# Patient Record
Sex: Female | Born: 1975 | Race: Black or African American | Hispanic: No | Smoking: Never smoker
Health system: Southern US, Community
[De-identification: ages and names within clinical notes are randomized; demographics above are authoritative.]

## PROBLEM LIST (undated history)

## (undated) DIAGNOSIS — E669 Obesity, unspecified: Secondary | ICD-10-CM

## (undated) DIAGNOSIS — E282 Polycystic ovarian syndrome: Secondary | ICD-10-CM

## (undated) DIAGNOSIS — I10 Essential (primary) hypertension: Secondary | ICD-10-CM

## (undated) HISTORY — PX: TUBAL LIGATION: SHX77

## (undated) HISTORY — PX: HERNIA REPAIR: SHX51

## (undated) HISTORY — PX: CHOLECYSTECTOMY: SHX55

## (undated) HISTORY — PX: STOMACH SURGERY: SHX791

## (undated) HISTORY — PX: LAPAROSCOPIC GASTRIC SLEEVE RESECTION: SHX5895

---

## 2017-02-10 ENCOUNTER — Encounter (HOSPITAL_BASED_OUTPATIENT_CLINIC_OR_DEPARTMENT_OTHER): Payer: Self-pay | Admitting: Emergency Medicine

## 2017-02-10 ENCOUNTER — Emergency Department (HOSPITAL_BASED_OUTPATIENT_CLINIC_OR_DEPARTMENT_OTHER)
Admission: EM | Admit: 2017-02-10 | Discharge: 2017-02-10 | Disposition: A | Discharge status: Home / Self Care | Payer: 59 | Attending: Emergency Medicine | Admitting: Emergency Medicine

## 2017-02-10 ENCOUNTER — Other Ambulatory Visit: Payer: Self-pay

## 2017-02-10 ENCOUNTER — Emergency Department (HOSPITAL_BASED_OUTPATIENT_CLINIC_OR_DEPARTMENT_OTHER): Payer: 59

## 2017-02-10 DIAGNOSIS — Y999 Unspecified external cause status: Secondary | ICD-10-CM | POA: Diagnosis not present

## 2017-02-10 DIAGNOSIS — S8002XA Contusion of left knee, initial encounter: Secondary | ICD-10-CM | POA: Diagnosis not present

## 2017-02-10 DIAGNOSIS — Y9389 Activity, other specified: Secondary | ICD-10-CM | POA: Insufficient documentation

## 2017-02-10 DIAGNOSIS — Z79899 Other long term (current) drug therapy: Secondary | ICD-10-CM | POA: Diagnosis not present

## 2017-02-10 DIAGNOSIS — S60222A Contusion of left hand, initial encounter: Secondary | ICD-10-CM

## 2017-02-10 DIAGNOSIS — Y92410 Unspecified street and highway as the place of occurrence of the external cause: Secondary | ICD-10-CM | POA: Insufficient documentation

## 2017-02-10 DIAGNOSIS — I10 Essential (primary) hypertension: Secondary | ICD-10-CM | POA: Insufficient documentation

## 2017-02-10 DIAGNOSIS — S8001XA Contusion of right knee, initial encounter: Secondary | ICD-10-CM

## 2017-02-10 DIAGNOSIS — S6992XA Unspecified injury of left wrist, hand and finger(s), initial encounter: Secondary | ICD-10-CM | POA: Diagnosis present

## 2017-02-10 HISTORY — DX: Essential (primary) hypertension: I10

## 2017-02-10 MED ORDER — METHOCARBAMOL 500 MG PO TABS: 500.0000 mg | ORAL_TABLET | Freq: Three times a day (TID) | ORAL | 0 refills | Status: DC | PRN

## 2017-02-10 MED ORDER — IBUPROFEN 600 MG PO TABS: 600.0000 mg | ORAL_TABLET | Freq: Three times a day (TID) | ORAL | 0 refills | Status: AC | PRN

## 2017-02-10 MED ORDER — IBUPROFEN 400 MG PO TABS
600.0000 mg | ORAL_TABLET | Freq: Once | ORAL | Status: AC
  Administered 2017-02-10: 600 mg via ORAL
  Filled 2017-02-10: qty 1

## 2017-02-10 MED ORDER — HYDROCODONE-ACETAMINOPHEN 5-325 MG PO TABS
1.0000 | ORAL_TABLET | Freq: Once | ORAL | Status: AC
  Administered 2017-02-10: 1 via ORAL
  Filled 2017-02-10: qty 1

## 2017-02-10 MED ORDER — CYCLOBENZAPRINE HCL 5 MG PO TABS
5.0000 mg | ORAL_TABLET | Freq: Once | ORAL | Status: AC
  Administered 2017-02-10: 5 mg via ORAL
  Filled 2017-02-10: qty 1

## 2017-02-10 MED FILL — METHOCARBAMOL 500 MG TABLET: 500 | 4 days supply | Qty: 12 | Fill #0

## 2017-02-10 MED FILL — IBUPROFEN 600 MG TABLET: 600 | 5 days supply | Qty: 15 | Fill #0

## 2017-02-10 NOTE — ED Notes (Signed)
ED Provider at bedside. 
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
## 2017-02-10 NOTE — ED Notes (Signed)
Patient transported to X-ray 
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
## 2022-11-14 ENCOUNTER — Ambulatory Visit
Admission: EM | Admit: 2022-11-14 | Discharge: 2022-11-14 | Disposition: A | Discharge status: Home / Self Care | Payer: BC Managed Care – PPO

## 2022-11-14 DIAGNOSIS — H1031 Unspecified acute conjunctivitis, right eye: Secondary | ICD-10-CM | POA: Diagnosis not present

## 2022-11-14 HISTORY — DX: Polycystic ovarian syndrome: E28.2

## 2022-11-14 MED ORDER — CIPROFLOXACIN HCL 0.3 % OP SOLN: OPHTHALMIC | 0 refills | Status: DC

## 2022-11-14 NOTE — ED Triage Notes (Signed)
Pt c/o swelling, redness, and drainage to R eye that began today. Eye was somewhat irritated last night. Pt wears contacts. No change in vision.

## 2022-11-14 NOTE — ED Provider Notes (Signed)
[facility]    CSN: [id number] Arrival date & time: 11/14/22  1906    HISTORY   Chief Complaint  Patient presents with   Eye Problem   HPI Danielle Koch is a pleasant, 47 y.o. female who presents to urgent care today. Patient states she wears contacts that she changes daily.  States that she noticed that her right eye was irritated last night.  States that this morning, she noticed that her right eye was swollen, red and draining.  Patient denies vision changes, frank eye pain, trauma to her right eye.  The history is provided by the patient.   Past Medical History:  Diagnosis Date   Hypertension    PCOS (polycystic ovarian syndrome)    There are no problems to display for this patient.  Past Surgical History:  Procedure Laterality Date   CHOLECYSTECTOMY     HERNIA REPAIR     STOMACH SURGERY     TUBAL LIGATION     OB History   No obstetric history on file.    Home Medications    Prior to Admission medications   Medication Sig Start Date End Date Taking? Authorizing Provider  amLODipine (NORVASC) 10 MG tablet Take 10 mg by mouth daily.   Yes [provider]  carvedilol (COREG) 12.5 MG tablet Take by mouth. 06/28/21 01/11/23 Yes [provider]  spironolactone (ALDACTONE) 25 MG tablet Take 12.5 mg by mouth. 10/14/22 10/14/23 Yes [provider]  hydrochlorothiazide (HYDRODIURIL) 25 MG tablet Take 25 mg by mouth daily. Patient not taking: Reported on 11/14/2022    [provider]  ibuprofen (ADVIL,MOTRIN) 600 MG tablet Take 1 tablet (600 mg total) by mouth every 8 (eight) hours as needed. 02/10/17   Azalia Bilis, MD  methocarbamol (ROBAXIN) 500 MG tablet Take 1 tablet (500 mg total) by mouth every 8 (eight) hours as needed for muscle spasms. 02/10/17   Azalia Bilis, MD    Family History No family history on file. Social History Social History   Tobacco Use   Smoking status: Never   Smokeless tobacco: Never  Substance Use  Topics   Alcohol use: Yes   Drug use: No   Allergies   Sumatriptan and Demerol [meperidine]  Review of Systems Review of Systems Pertinent findings revealed after performing a 14 point review of systems has been noted in the history of present illness.  Physical Exam Vital Signs BP (!) 163/107 (BP Location: Right Arm)   Pulse 66   Temp 98.1 F (36.7 C) (Oral)   Resp 18   SpO2 97%   No data found.  Physical Exam Vitals and nursing note reviewed.  Constitutional:      General: She is not in acute distress.    Appearance: Normal appearance.  HENT:     Head: Normocephalic and atraumatic.  Eyes:     General: Vision grossly intact.        Right eye: Discharge present. No foreign body or hordeolum.        Left eye: No foreign body, discharge or hordeolum.     Extraocular Movements: Extraocular movements intact.     Conjunctiva/sclera:     Right eye: Right conjunctiva is injected. Exudate present. No chemosis or hemorrhage.    Left eye: Left conjunctiva is not injected. No chemosis, exudate or hemorrhage.    Pupils: Pupils are equal, round, and reactive to light.     Comments: Right upper and lower eyelid with mild erythema and swelling  Cardiovascular:  Rate and Rhythm: Normal rate and regular rhythm.  Pulmonary:     Effort: Pulmonary effort is normal.     Breath sounds: Normal breath sounds.  Musculoskeletal:        General: Normal range of motion.     Cervical back: Normal range of motion and neck supple.  Skin:    General: Skin is warm and dry.  Neurological:     General: No focal deficit present.     Mental Status: She is alert and oriented to person, place, and time. Mental status is at baseline.  Psychiatric:        Mood and Affect: Mood normal.        Behavior: Behavior normal.        Thought Content: Thought content normal.        Judgment: Judgment normal.     Visual Acuity Right Eye Distance:   Left Eye Distance:   Bilateral Distance:    Right  Eye Near:   Left Eye Near:    Bilateral Near:     UC Couse / Diagnostics / Procedures:     Radiology No results found.  Procedures Procedures (including critical care time) EKG  Pending results:  Labs Reviewed - No data to display  Medications Ordered in UC: Medications - No data to display  UC Diagnoses / Final Clinical Impressions(s)   I have reviewed the triage vital signs and the nursing notes.  Pertinent labs & imaging results that were available during my care of the patient were reviewed by me and considered in my medical decision making (see chart for details).    Final diagnoses:  Acute bacterial conjunctivitis of right eye   Patient provided with a 7-day course of ciprofloxacin eyedrops for treatment of acute bacterial conjunctivitis in her right eye.  Patient advised not to wear her contacts until completing treatment.  Patient advised to see her eye doctor or go to the emergency room if symptoms worsen despite treatment.  Please see discharge instructions below for details of plan of care as provided to patient. ED Prescriptions     Medication Sig Dispense Auth. Provider   ciprofloxacin (CILOXAN) 0.3 % ophthalmic solution Administer 1 drop, every 2 hours, while awake, for 2 days. Then 1 drop, every 4 hours, while awake, for the next 5 days. 5 mL Theadora Rama Scales, PA-C      PDMP not reviewed this encounter.  Pending results:  Labs Reviewed - No data to display  Discharge Instructions:   Discharge Instructions      Please read below to learn more about the medications, dosages and frequencies that I recommend to help alleviate your symptoms and to get you feeling better soon:   Ciloxan (ciprofloxacin): Please instill 1 drop into the affected eye(s) every 2 hours while awake for days 1 and 2 then instill 1 drop into the affected eye(s) every 4 hours while awake for days 3 through 7.  Please be sure that you finish the full 7-day treatment.   If  symptoms have not meaningfully improved in the next 5 to 7 days, please return for repeat evaluation or follow-up with your regular provider.  If symptoms have worsened in the next 3 to 5 days, please follow-up with your eye doctor or go to the emergency room.  Thank you for visiting urgent care today.  We appreciate the opportunity to participate in your care.       Disposition Upon Discharge:  Condition: stable for discharge home  Patient presented with an acute illness with associated systemic symptoms and significant discomfort requiring urgent management. In my opinion, this is a condition that a prudent lay person (someone who possesses an average knowledge of health and medicine) may potentially expect to result in complications if not addressed urgently such as respiratory distress, impairment of bodily function or dysfunction of bodily organs.   Routine symptom specific, illness specific and/or disease specific instructions were discussed with the patient and/or caregiver at length.   As such, the patient has been evaluated and assessed, work-up was performed and treatment was provided in alignment with urgent care protocols and evidence based medicine.  Patient/parent/caregiver has been advised that the patient may require follow up for further testing and treatment if the symptoms continue in spite of treatment, as clinically indicated and appropriate.  Patient/parent/caregiver has been advised to return to the [facility] or PCP if no better; to PCP or the Emergency Department if new signs and symptoms develop, or if the current signs or symptoms continue to change or worsen for further workup, evaluation and treatment as clinically indicated and appropriate  The patient will follow up with their current PCP if and as advised. If the patient does not currently have a PCP we will assist them in obtaining one.   The patient may need specialty follow up if the symptoms continue, in spite of  conservative treatment and management, for further workup, evaluation, consultation and treatment as clinically indicated and appropriate.  Patient/parent/caregiver verbalized understanding and agreement of plan as discussed.  All questions were addressed during visit.  Please see discharge instructions below for further details of plan.  This office note has been dictated using Teaching laboratory technician.  Unfortunately, this method of dictation can sometimes lead to typographical or grammatical errors.  I apologize for your inconvenience in advance if this occurs.  Please do not hesitate to reach out to me if clarification is needed.      Theadora Rama Scales, [place] 11/14/22 1931

## 2022-11-14 NOTE — Discharge Instructions (Signed)
Please read below to learn more about the medications, dosages and frequencies that I recommend to help alleviate your symptoms and to get you feeling better soon:   Ciloxan (ciprofloxacin): Please instill 1 drop into the affected eye(s) every 2 hours while awake for days 1 and 2 then instill 1 drop into the affected eye(s) every 4 hours while awake for days 3 through 7.  Please be sure that you finish the full 7-day treatment.   If symptoms have not meaningfully improved in the next 5 to 7 days, please return for repeat evaluation or follow-up with your regular provider.  If symptoms have worsened in the next 3 to 5 days, please follow-up with your eye doctor or go to the emergency room.  Thank you for visiting urgent care today.  We appreciate the opportunity to participate in your care.

## 2022-12-19 ENCOUNTER — Ambulatory Visit
Admission: EM | Admit: 2022-12-19 | Discharge: 2022-12-19 | Disposition: A | Discharge status: Home / Self Care | Payer: BC Managed Care – PPO

## 2022-12-19 ENCOUNTER — Ambulatory Visit: Payer: BC Managed Care – PPO

## 2022-12-19 DIAGNOSIS — M79644 Pain in right finger(s): Secondary | ICD-10-CM

## 2022-12-19 DIAGNOSIS — M79641 Pain in right hand: Secondary | ICD-10-CM | POA: Diagnosis not present

## 2022-12-19 DIAGNOSIS — M25522 Pain in left elbow: Secondary | ICD-10-CM | POA: Diagnosis not present

## 2022-12-19 DIAGNOSIS — M79632 Pain in left forearm: Secondary | ICD-10-CM | POA: Diagnosis not present

## 2022-12-19 NOTE — ED Provider Notes (Signed)
[facility]  Note:  This document was prepared using Dragon voice recognition software and may include unintentional dictation errors.  MRN: [id number] DOB: July 08, 1975 DATE: 12/19/22   Subjective:  Chief Complaint:  Chief Complaint  Patient presents with   Finger Injury    HPI: Danielle Koch is a 47 y.o. female presenting for right middle finger pain and left elbow pain. Patient states she caught her right middle finger in the car door handle on Saturday.  Reports pain with flexion.  Patient is right-hand dominant.  No prior injury or fracture.  Patient also presents for left elbow pain for 2 days.  Patient states she ran into the brick wall at her house and has been having ongoing elbow pain since.  Reports pain with flexion.  No prior injury or fracture.  Patient has taken ibuprofen with no relief. Denies fever, nausea/vomiting, abdominal pain, numbness/tingling. Endorses right middle finger pain, left elbow pain. Presents NAD.  Prior to Admission medications   Medication Sig Start Date End Date Taking? Authorizing Provider  nystatin (MYCOSTATIN/NYSTOP) powder Apply topically. 12/16/22 06/14/23 Yes [provider]  omeprazole (PRILOSEC) 20 MG capsule Take 1 tablet by mouth daily. 03/23/22  Yes [provider]  OZEMPIC, 1 MG/DOSE, 4 MG/3ML SOPN Inject into the skin. 03/23/22  Yes [provider]  amLODipine (NORVASC) 10 MG tablet Take 10 mg by mouth daily.    [provider]  carvedilol (COREG) 12.5 MG tablet Take by mouth. 06/28/21 01/11/23  [provider]  ciprofloxacin (CILOXAN) 0.3 % ophthalmic solution Administer 1 drop, every 2 hours, while awake, for 2 days. Then 1 drop, every 4 hours, while awake, for the next 5 days. 11/14/22   Theadora Rama Scales, PA-C  ibuprofen (ADVIL,MOTRIN) 600 MG tablet Take 1 tablet (600 mg total) by mouth every 8 (eight) hours as needed. 02/10/17   Azalia Bilis, MD  methocarbamol (ROBAXIN) 500 MG tablet Take  1 tablet (500 mg total) by mouth every 8 (eight) hours as needed for muscle spasms. 02/10/17   Azalia Bilis, MD  spironolactone (ALDACTONE) 25 MG tablet Take 12.5 mg by mouth. 10/14/22 10/14/23  [provider]     Allergies  Allergen Reactions   Sumatriptan Other (See Comments)    Also reports she developed a knot under her chin and jaw tightness  Also reports she developed a knot under her chin and jaw tightness, , And Hives  Also reports she developed a knot under her chin and jaw tightness    And Hives  Other reaction(s): Dizziness (intolerance), Other  Also reports she developed a knot under her chin and jaw tightness  Also reports she developed a knot under her chin and jaw tightness    And Hives   Demerol [Meperidine]     History:   Past Medical History:  Diagnosis Date   Hypertension    PCOS (polycystic ovarian syndrome)      Past Surgical History:  Procedure Laterality Date   CHOLECYSTECTOMY     HERNIA REPAIR     STOMACH SURGERY     TUBAL LIGATION      History reviewed. No pertinent family history.  Social History   Tobacco Use   Smoking status: Never   Smokeless tobacco: Never  Substance Use Topics   Alcohol use: Yes   Drug use: No    Review of Systems  Constitutional:  Negative for fever.  Gastrointestinal:  Negative for abdominal pain, nausea and vomiting.  Musculoskeletal:  Positive for arthralgias.  Objective:   Vitals: BP (!) 165/108 (BP Location: Right Arm)   Pulse 73   Temp 98.5 F (36.9 C) (Oral)   Resp 16   SpO2 98%   Physical Exam Constitutional:      General: She is not in acute distress.    Appearance: Normal appearance. She is well-developed. She is morbidly obese. She is not ill-appearing or toxic-appearing.  HENT:     Head: Normocephalic and atraumatic.  Cardiovascular:     Rate and Rhythm: Normal rate and regular rhythm.     Pulses:          Radial pulses are 2+ on the right side and 2+ on the left side.      Heart sounds: Normal heart sounds.  Pulmonary:     Effort: Pulmonary effort is normal.     Breath sounds: Normal breath sounds.     Comments: Clear to auscultation bilaterally  Abdominal:     General: Bowel sounds are normal.     Palpations: Abdomen is soft.     Tenderness: There is no abdominal tenderness.  Musculoskeletal:     Right elbow: Normal.     Left elbow: Decreased range of motion. Tenderness present in lateral epicondyle.     Right hand: Tenderness present. Decreased range of motion. Normal pulse.     Left hand: Normal.     Comments: Tenderness to palpation of right third digit at PIP.  Decreased range of motion due to pain with flexion.  Neurovascular intact.  No warmth, erythema, discharge.  Tenderness to palpation of lateral epicondyle.  Decreased range of motion due to pain with flexion and extension.  Neurovascular intact.  No warmth, erythema, discharge.  Skin:    General: Skin is warm and dry.  Neurological:     General: No focal deficit present.     Mental Status: She is alert.  Psychiatric:        Mood and Affect: Mood and affect normal.     Results:  Labs: No results found for this or any previous visit (from the past 24 hour(s)).  Radiology: DG Forearm Left  Result Date: 12/19/2022 CLINICAL DATA:  Left forearm injury with pain EXAM: LEFT FOREARM - 2 VIEW COMPARISON:  None Available. FINDINGS: No fracture. No evidence of malalignment at the left wrist or left elbow. No focal osseous lesions. Small posterior left olecranon enthesophyte. No radiopaque foreign bodies. IMPRESSION: No fracture or malalignment. Small posterior left olecranon enthesophyte. Electronically Signed   By: Delbert Phenix M.D.   On: 12/19/2022 19:43   DG Hand Complete Right  Result Date: 12/19/2022 CLINICAL DATA:  Third digit pain after injury. EXAM: RIGHT HAND - COMPLETE 3+ VIEW COMPARISON:  None Available. FINDINGS: There is a well corticated density along the dorsal aspect of the  third distal interphalangeal joint which is favored as chronic. No acute fracture or dislocation identified. Joint spaces are maintained. There is a small exostosis from the lateral aspect of the second metacarpal head. There is no evidence of arthropathy or other focal bone abnormality. Soft tissues are unremarkable. IMPRESSION: Well corticated density along the dorsal aspect of the third distal interphalangeal joint is favored as chronic. No acute fracture or dislocation identified. Electronically Signed   By: Darliss Cheney M.D.   On: 12/19/2022 19:42     UC Course/Treatments:  Procedures: Splint Application  Date/Time: 12/19/2022 8:01 PM  Performed by: Cynda Acres, PA-C Authorized by: Cynda Acres, PA-C   Consent:  Consent obtained:  Verbal   Consent given by:  Patient   Risks, benefits, and alternatives were discussed: yes     Risks discussed:  Pain   Alternatives discussed:  No treatment Pre-procedure details:    Distal neurologic exam:  Normal   Distal perfusion: distal pulses strong and brisk capillary refill   Procedure details:    Location:  Finger   Finger location:  R long finger   Splint type:  Finger   Supplies:  Aluminum splint Post-procedure details:    Distal neurologic exam:  Normal   Distal perfusion: distal pulses strong and brisk capillary refill     Procedure completion:  Tolerated well, no immediate complications    Medications Ordered in UC: Medications - No data to display   Assessment and Plan :     ICD-10-CM   1. Pain of right middle finger  M79.644     2. Left elbow pain  M25.522      Pain of right middle finger Afebrile, nontoxic-appearing, NAD. VSS. DDX includes but not limited to: Fracture, dislocation, contusion Imaging was negative for fracture or dislocation.  Splint was placed.  Recommend RICE regimen and over-the-counter analgesia as needed for pain.  If no improvement, recommend she follow-up with orthopedics. Strict ED  precautions were given and patient verbalized understanding.  Left elbow pain Afebrile, nontoxic-appearing, NAD. VSS. DDX includes but not limited to: Fracture, dislocation, contusion Imaging was negative for fracture or dislocation. Recommend RICE regimen and over-the-counter analgesia as needed for pain.  If no improvement, recommend she follow-up with orthopedics. Strict ED precautions were given and patient verbalized understanding.    ED Discharge Orders     None        I have reviewed the PDMP during this encounter.     Cynda Acres, PA-C 12/19/22 2002

## 2022-12-19 NOTE — ED Triage Notes (Signed)
Pt c/o right middle finger pain and swelling states she jammed finger on Saturday. Pt c/o L shoulder and left elbow pain x 2 days upon waking. Pt states she did bump her arm on door jam while walking through door the day before.

## 2022-12-19 NOTE — Discharge Instructions (Addendum)
Your x-rays showed no broken bones or dislocations. They were sent to a radiologist for further evaluation and we will call you if the radiologist sees anything different.   Recommend rest, ice, compression, and elevation. You can alternate Tylenol and NSAIDs (Ibuprofen, Alleve) as needed for pain.   You were provided with a brace/splint today in office. I recommend you wear it for the next week. If no improvement within that time, you should follow up with orthopedics.   It is very important for you to pay attention to any new symptoms or worsening of your current condition. Please go directly to the Emergency Department immediately should you begin to feel worse in any way or have any of the following symptoms: fevers, increased pain, increased swelling, increased redness, or color changes.

## 2023-01-24 ENCOUNTER — Ambulatory Visit
Admission: EM | Admit: 2023-01-24 | Discharge: 2023-01-24 | Disposition: A | Discharge status: Home / Self Care | Payer: BC Managed Care – PPO | Attending: Emergency Medicine | Admitting: Emergency Medicine

## 2023-01-24 DIAGNOSIS — J029 Acute pharyngitis, unspecified: Secondary | ICD-10-CM | POA: Diagnosis not present

## 2023-01-24 DIAGNOSIS — H66002 Acute suppurative otitis media without spontaneous rupture of ear drum, left ear: Secondary | ICD-10-CM

## 2023-01-24 DIAGNOSIS — J0140 Acute pansinusitis, unspecified: Secondary | ICD-10-CM

## 2023-01-24 DIAGNOSIS — U071 COVID-19: Secondary | ICD-10-CM | POA: Diagnosis not present

## 2023-01-24 LAB — POC COVID19/FLU A&B COMBO
Covid Antigen, POC: POSITIVE — AB
Influenza A Antigen, POC: NEGATIVE
Influenza B Antigen, POC: NEGATIVE

## 2023-01-24 MED ORDER — PAXLOVID (300/100) 20 X 150 MG & 10 X 100MG PO TBPK: 3.0000 | ORAL_TABLET | Freq: Two times a day (BID) | ORAL | 0 refills | Status: AC

## 2023-01-24 MED ORDER — CEFDINIR 300 MG PO CAPS: 300.0000 mg | ORAL_CAPSULE | Freq: Two times a day (BID) | ORAL | 0 refills | Status: AC

## 2023-01-24 NOTE — ED Provider Notes (Signed)
 Danielle Koch    CSN: [id number] Arrival date & time: 01/24/23  1642    HISTORY   Chief Complaint  Patient presents with   Sore Throat   Cough   HPI Danielle Koch is a pleasant, 47 y.o. female who presents to urgent care today. Pt c/o dry cough, sore throat, pain with swallowing, frontal and maxillary sinus pressure and pain bilaterally, nasal congestion and clear rhinorrhea x3 days.  Patient states her throat hurts to cough and talk in her voice sounds muffled.  States she taken OTC cold meds with minor relief.  Patient has normal vital signs on arrival today.  Patient denies nausea, vomiting, diarrhea, fever, body aches, chills  The history is provided by the patient.   Past Medical History:  Diagnosis Date   Hypertension    PCOS (polycystic ovarian syndrome)    There are no active problems to display for this patient.  Past Surgical History:  Procedure Laterality Date   CHOLECYSTECTOMY     HERNIA REPAIR     STOMACH SURGERY     TUBAL LIGATION     OB History   No obstetric history on file.    Home Medications    Prior to Admission medications   Medication Sig Start Date End Date Taking? Authorizing Provider  amLODipine (NORVASC) 10 MG tablet Take 10 mg by mouth daily.    [provider]  ciprofloxacin  (CILOXAN ) 0.3 % ophthalmic solution Administer 1 drop, every 2 hours, while awake, for 2 days. Then 1 drop, every 4 hours, while awake, for the next 5 days. 11/14/22   Joesph Shaver Scales, PA-C  ibuprofen  (ADVIL ,MOTRIN ) 600 MG tablet Take 1 tablet (600 mg total) by mouth every 8 (eight) hours as needed. 02/10/17   Campos, Kevin, MD  nystatin (MYCOSTATIN/NYSTOP) powder Apply topically. 12/16/22 06/14/23  [provider]  omeprazole (PRILOSEC) 20 MG capsule Take 1 tablet by mouth daily. 03/23/22   [provider]  OZEMPIC, 1 MG/DOSE, 4 MG/3ML SOPN Inject into the skin. 03/23/22   [provider]  spironolactone (ALDACTONE) 25 MG  tablet Take 12.5 mg by mouth. 10/14/22 10/14/23  [provider]    Family History No family history on file. Social History Social History   Tobacco Use   Smoking status: Never   Smokeless tobacco: Never  Vaping Use   Vaping status: Never Used  Substance Use Topics   Alcohol use: Yes   Drug use: No   Allergies   Demerol [meperidine] and Sumatriptan  Review of Systems Review of Systems Pertinent findings revealed after performing a 14 point review of systems has been noted in the history of present illness.  Physical Exam Vital Signs BP (!) 150/94 (BP Location: Right Arm)   Pulse 71   Temp 98.3 F (36.8 C) (Oral)   Resp 20   SpO2 97%   No data found.  Physical Exam Vitals and nursing note reviewed.  Constitutional:      General: She is awake.     Appearance: Normal appearance. She is well-developed and well-groomed. She is ill-appearing.  HENT:     Head: Normocephalic and atraumatic.     Salivary Glands: Right salivary gland is not diffusely enlarged or tender. Left salivary gland is not diffusely enlarged or tender.     Right Ear: Hearing, tympanic membrane, ear canal and external ear normal.     Left Ear: Hearing, ear canal and external ear normal. A middle ear effusion is present. Tympanic membrane is injected  and erythematous. Tympanic membrane is not retracted or bulging.     Nose: Congestion and rhinorrhea present. Rhinorrhea is clear.     Right Sinus: No maxillary sinus tenderness or frontal sinus tenderness.     Left Sinus: No maxillary sinus tenderness.     Mouth/Throat:     Mouth: Mucous membranes are moist.     Pharynx: Uvula midline. Pharyngeal swelling, posterior oropharyngeal erythema and uvula swelling present. No oropharyngeal exudate or postnasal drip.     Tonsils: No tonsillar exudate. 0 on the right. 0 on the left.  Eyes:     General: Lids are normal.     Pupils: Pupils are equal, round, and reactive to light.  Cardiovascular:     Rate  and Rhythm: Normal rate and regular rhythm.     Pulses: Normal pulses.     Heart sounds: Normal heart sounds, S1 normal and S2 normal.  Pulmonary:     Effort: Pulmonary effort is normal. No tachypnea, bradypnea, accessory muscle usage, prolonged expiration or respiratory distress.     Breath sounds: No stridor, decreased air movement or transmitted upper airway sounds. No decreased breath sounds, wheezing, rhonchi or rales.  Abdominal:     General: Abdomen is flat. Bowel sounds are normal.     Palpations: Abdomen is soft.  Musculoskeletal:        General: Normal range of motion.     Cervical back: Full passive range of motion without pain, normal range of motion and neck supple.  Lymphadenopathy:     Cervical: Cervical adenopathy present.     Right cervical: Superficial cervical adenopathy and posterior cervical adenopathy present.     Left cervical: Superficial cervical adenopathy and posterior cervical adenopathy present.  Skin:    General: Skin is warm and dry.  Neurological:     General: No focal deficit present.     Mental Status: She is alert and oriented to person, place, and time.     Motor: Motor function is intact.     Coordination: Coordination is intact.     Gait: Gait is intact.     Deep Tendon Reflexes: Reflexes are normal and symmetric.  Psychiatric:        Attention and Perception: Attention and perception normal.        Mood and Affect: Mood and affect normal.        Speech: Speech normal.        Behavior: Behavior normal. Behavior is cooperative.        Thought Content: Thought content normal.      Visual Acuity Right Eye Distance:   Left Eye Distance:   Bilateral Distance:    Right Eye Near:   Left Eye Near:    Bilateral Near:     [facility] / Diagnostics / Procedures:     Radiology No results found.  Procedures Procedures (including critical care time) EKG  Pending results:  Labs Reviewed  POC COVID19/FLU A&B COMBO - Abnormal; Notable for the  following components:      Result Value   Covid Antigen, POC Positive (*)    All other components within normal limits    Medications Ordered in [facility]: Medications - No data to display  [facility] Diagnoses / Final Clinical Impressions(s)   I have reviewed the triage vital signs and the nursing notes.  Pertinent labs & imaging results that were available during my care of the patient were reviewed by me and considered in my medical decision making (see chart  for details).    Final diagnoses:  Acute pharyngitis, unspecified etiology  Acute suppurative otitis media of left ear without spontaneous rupture of tympanic membrane, recurrence not specified  Acute pansinusitis, recurrence not specified  COVID-19   Patient tested positive for COVID-19.  Patient provided with a prescription for Paxlovid  given gravidity of morbid obesity increasing her risk of hospitalization due to COVID-19 infection.  Physical exam findings also concerning for an acute bacterial infection in her left inner ear, patient provided with a 10-day course of cefdinir  for treatment of this.  Supportive care and medications discussed.  Conservative care recommended.  Return precautions advised.  Please see discharge instructions below for details of plan of care as provided to patient. ED Prescriptions     Medication Sig Dispense Auth. Provider   cefdinir  (OMNICEF ) 300 MG capsule Take 1 capsule (300 mg total) by mouth 2 (two) times daily for 10 days. 20 capsule Joesph Shaver Scales, PA-C   nirmatrelvir/ritonavir (PAXLOVID , 300/100,) 20 x 150 MG & 10 x 100MG  TBPK Take 3 tablets by mouth 2 (two) times daily for 5 days. Patient GFR is normal.  Take nirmatrelvir (150 mg) two tablets twice daily for 5 days and ritonavir (100 mg) one tablet twice daily for 5 days. 30 tablet Joesph Shaver Scales, PA-C      PDMP not reviewed this encounter.  Pending results:  Labs Reviewed  POC COVID19/FLU A&B COMBO - Abnormal; Notable for the  following components:      Result Value   Covid Antigen, POC Positive (*)    All other components within normal limits      Discharge Instructions      Your rapid influenza antigen test today was negative.  No further influenza testing is indicated.     If your COVID-19 test is positive, you will benefit from antiviral therapy treatment for COVID-19.  I have sent a prescription for Paxlovid  to your pharmacy.  Please take 3 tablets in the morning and 3 tablets in the evening for the next 5 days.  Paxlovid  may not immediately improve your symptoms as soon as you start taking it but Paxlovid  will prevent COVID-19 from making you any sicker by reducing the amount of virus in your body which will make it easier for you to recover from this infection.     Conservative care is recommended with rest, drinking plenty of clear fluids, eating only when hungry, taking supportive medications for your symptoms and avoiding being around other people.  Please remain at home until you are fever free for 24 hours without the use of antifever medications such as Tylenol  and ibuprofen .   Based on my physical exam findings and the history you have provided  today, I do not recommend antibiotics at this time.  I do not believe the risks and side effects of antibiotics would outweigh any minimal benefit that they might provide.       Please read below to learn more about the medications, dosages and frequencies that I recommend to help alleviate your symptoms and to get you feeling better soon:   Omnicef  (cefdinir ):  Please take one (1) dose twice daily for 10 days.  This antibiotic can cause upset stomach, this will resolve once antibiotics are complete.  You are welcome to take a probiotic, eat yogurt, take Imodium while taking this medication.  Please avoid other systemic medications such as Maalox, Pepto-Bismol or milk of magnesia as they can interfere with the body's ability to absorb the antibiotics.  Advil ,  Motrin  (ibuprofen ): This is a good anti-inflammatory medication which addresses aches, pains and inflammation of the upper airways that causes sinus and nasal congestion as well as in the lower airways which makes your cough feel tight and sometimes burn.  I recommend that you take between 400 to 600 mg every 6-8 hours as needed.  Please do not take more than 2400 mg of ibuprofen  in a 24-hour period and please do not take high doses of ibuprofen  for more than 3 days in a row as this can lead to stomach ulcers.   Tylenol  (acetaminophen ): This is a good fever reducer.  If your body temperature rises above 101.5 as measured with a thermometer, it is recommended that you take 1,000 mg every 8 hours until your temperature falls below 101.5.  Please be careful when taking Tylenol  (acetaminophen ) along with other medications containing acetaminophen  or Tylenol .  The maximum dose of Tylenol  in a 24-hour period is 3000 mg, taking more than 3000 mg per day can damage your liver.         Chloraseptic Throat Spray: Spray 5 sprays into affected area every 2 hours, hold for 15 seconds and either swallow or spit it out.  This is a excellent numbing medication because it is a spray, you can put it right where you needed and so sucking on a lozenge and numbing your entire mouth.      If symptoms have not meaningfully improved in the next 7 to 10 days, please return for repeat evaluation or follow-up with your regular provider.  If symptoms have worsened in the next 3 to 5 days, please return for repeat evaluation or follow-up with your regular provider.    Thank you for visiting urgent care today.  We appreciate the opportunity to participate in your care.       Disposition Upon Discharge:  Condition: stable for discharge home  Patient presented with an acute illness with associated systemic symptoms and significant discomfort requiring urgent management. In my opinion, this is a condition that a prudent lay person  (someone who possesses an average knowledge of health and medicine) may potentially expect to result in complications if not addressed urgently such as respiratory distress, impairment of bodily function or dysfunction of bodily organs.   Routine symptom specific, illness specific and/or disease specific instructions were discussed with the patient and/or caregiver at length.   As such, the patient has been evaluated and assessed, work-up was performed and treatment was provided in alignment with urgent care protocols and evidence based medicine.  Patient/parent/caregiver has been advised that the patient may require follow up for further testing and treatment if the symptoms continue in spite of treatment, as clinically indicated and appropriate.  Patient/parent/caregiver has been advised to return to the [facility] or PCP if no better; to PCP or the Emergency Department if new signs and symptoms develop, or if the current signs or symptoms continue to change or worsen for further workup, evaluation and treatment as clinically indicated and appropriate  The patient will follow up with their current PCP if and as advised. If the patient does not currently have a PCP we will assist them in obtaining one.   The patient may need specialty follow up if the symptoms continue, in spite of conservative treatment and management, for further workup, evaluation, consultation and treatment as clinically indicated and appropriate.  Patient/parent/caregiver verbalized understanding and agreement of plan as discussed.  All questions were addressed during visit.  Please see  discharge instructions below for further details of plan.  This office note has been dictated using Teaching laboratory technician.  Unfortunately, this method of dictation can sometimes lead to typographical or grammatical errors.  I apologize for your inconvenience in advance if this occurs.  Please do not hesitate to reach out to me if  clarification is needed.      Joesph Shaver Scales, PA-C 01/24/23 1758

## 2023-01-24 NOTE — Discharge Instructions (Addendum)
 Your rapid influenza antigen test today was negative.  No further influenza testing is indicated.     If your COVID-19 test is positive, you will benefit from antiviral therapy treatment for COVID-19.  I have sent a prescription for Paxlovid  to your pharmacy.  Please take 3 tablets in the morning and 3 tablets in the evening for the next 5 days.  Paxlovid  may not immediately improve your symptoms as soon as you start taking it but Paxlovid  will prevent COVID-19 from making you any sicker by reducing the amount of virus in your body which will make it easier for you to recover from this infection.     Conservative care is recommended with rest, drinking plenty of clear fluids, eating only when hungry, taking supportive medications for your symptoms and avoiding being around other people.  Please remain at home until you are fever free for 24 hours without the use of antifever medications such as Tylenol  and ibuprofen .   Based on my physical exam findings and the history you have provided  today, I do not recommend antibiotics at this time.  I do not believe the risks and side effects of antibiotics would outweigh any minimal benefit that they might provide.       Please read below to learn more about the medications, dosages and frequencies that I recommend to help alleviate your symptoms and to get you feeling better soon:   Omnicef  (cefdinir ):  Please take one (1) dose twice daily for 10 days.  This antibiotic can cause upset stomach, this will resolve once antibiotics are complete.  You are welcome to take a probiotic, eat yogurt, take Imodium while taking this medication.  Please avoid other systemic medications such as Maalox, Pepto-Bismol or milk of magnesia as they can interfere with the body's ability to absorb the antibiotics.   Advil , Motrin  (ibuprofen ): This is a good anti-inflammatory medication which addresses aches, pains and inflammation of the upper airways that causes sinus and nasal  congestion as well as in the lower airways which makes your cough feel tight and sometimes burn.  I recommend that you take between 400 to 600 mg every 6-8 hours as needed.  Please do not take more than 2400 mg of ibuprofen  in a 24-hour period and please do not take high doses of ibuprofen  for more than 3 days in a row as this can lead to stomach ulcers.   Tylenol  (acetaminophen ): This is a good fever reducer.  If your body temperature rises above 101.5 as measured with a thermometer, it is recommended that you take 1,000 mg every 8 hours until your temperature falls below 101.5.  Please be careful when taking Tylenol  (acetaminophen ) along with other medications containing acetaminophen  or Tylenol .  The maximum dose of Tylenol  in a 24-hour period is 3000 mg, taking more than 3000 mg per day can damage your liver.         Chloraseptic Throat Spray: Spray 5 sprays into affected area every 2 hours, hold for 15 seconds and either swallow or spit it out.  This is a excellent numbing medication because it is a spray, you can put it right where you needed and so sucking on a lozenge and numbing your entire mouth.      If symptoms have not meaningfully improved in the next 7 to 10 days, please return for repeat evaluation or follow-up with your regular provider.  If symptoms have worsened in the next 3 to 5 days, please return for repeat  evaluation or follow-up with your regular provider.    Thank you for visiting urgent care today.  We appreciate the opportunity to participate in your care.

## 2023-01-24 NOTE — ED Triage Notes (Signed)
Pt c/o dry cough, sore throat, nasal congestion and drainage x3 days. Pt also reports HA and facial pain. Has taken OTC cold meds with minor relief.

## 2023-02-26 ENCOUNTER — Encounter: Payer: Self-pay | Admitting: *Deleted

## 2023-02-26 ENCOUNTER — Ambulatory Visit
Admission: EM | Admit: 2023-02-26 | Discharge: 2023-02-26 | Disposition: A | Discharge status: Home / Self Care | Payer: BC Managed Care – PPO | Attending: Emergency Medicine | Admitting: Emergency Medicine

## 2023-02-26 DIAGNOSIS — H1031 Unspecified acute conjunctivitis, right eye: Secondary | ICD-10-CM | POA: Diagnosis not present

## 2023-02-26 DIAGNOSIS — L03213 Periorbital cellulitis: Secondary | ICD-10-CM

## 2023-02-26 HISTORY — DX: Obesity, unspecified: E66.9

## 2023-02-26 MED ORDER — CEFDINIR 300 MG PO CAPS: 300.0000 mg | ORAL_CAPSULE | Freq: Two times a day (BID) | ORAL | 0 refills | Status: AC

## 2023-02-26 MED ORDER — CIPROFLOXACIN HCL 0.3 % OP SOLN: OPHTHALMIC | 0 refills | Status: AC

## 2023-02-26 NOTE — Discharge Instructions (Signed)
Please read below to learn more about the medications, dosages and frequencies that I recommend to help alleviate your symptoms and to get you feeling better soon:   Omnicef (cefdinir):  Please take one (1) dose twice daily for 7 days.  This antibiotic can cause upset stomach, this will resolve once antibiotics are complete.  You are welcome to take a probiotic, eat yogurt, take Imodium while taking this medication.  Please avoid other systemic medications such as Maalox, Pepto-Bismol or milk of magnesia as they can interfere with the body's ability to absorb the antibiotics.   Ciloxan (ciprofloxacin): Please instill 1 drop into the affected eye(s) every 2 hours while awake for days 1 and 2 then instill 1 drop into the affected eye(s) every 4 hours while awake for days 3 through 7.  Please be sure that you finish the full 7-day treatment.   If you have not had meaningful improvement of your symptoms in the next 2 to 3 days, please follow-up with your ophthalmologist for further evaluation.  Thank you for visiting [facility] today.

## 2023-02-26 NOTE — ED Triage Notes (Signed)
Reports removing contact lenses last night and noticed blurred vision in right eye; states started using old leftover ciprofloxacin eye gtts. Today c/o pain in right eye along with blurred vision, and tearing.

## 2023-02-26 NOTE — ED Provider Notes (Signed)
[facility]    CSN: [id number] Arrival date & time: 02/26/23  1420    HISTORY  No chief complaint on file.  HPI Danielle Koch is a pleasant, 48 y.o. female who presents to urgent care today. Patient states that last night after removing her contact lens, she noticed blurry vision in her right eye but no pain.  Patient states that she began using some leftover ciprofloxacin eyedrops last night.  Patient states that today she noticed that her vision in her right eye is still blurry and now has eye pain with excessive tearing.  States her contact lenses are prescription, colored and can be worn 30 days at a time however she only wears them for a few hours at a time each day, just when she is going out and about.  Patient denies causing trauma to her eye with removing her contact last night, states her fingernails are short and did not have any pain last night.  The history is provided by the patient.   Past Medical History:  Diagnosis Date   Hypertension    Obesity    PCOS (polycystic ovarian syndrome)    There are no active problems to display for this patient.  Past Surgical History:  Procedure Laterality Date   CHOLECYSTECTOMY     HERNIA REPAIR     LAPAROSCOPIC GASTRIC SLEEVE RESECTION     STOMACH SURGERY     TUBAL LIGATION     OB History   No obstetric history on file.    Home Medications    Prior to Admission medications   Medication Sig Start Date End Date Taking? Authorizing Provider  amLODipine (NORVASC) 10 MG tablet Take 10 mg by mouth daily.    [provider]  ciprofloxacin (CILOXAN) 0.3 % ophthalmic solution Administer 1 drop, every 2 hours, while awake, for 2 days. Then 1 drop, every 4 hours, while awake, for the next 5 days. 11/14/22   Theadora Rama Scales, PA-C  ibuprofen (ADVIL,MOTRIN) 600 MG tablet Take 1 tablet (600 mg total) by mouth every 8 (eight) hours as needed. 02/10/17   Azalia Bilis, MD  nystatin (MYCOSTATIN/NYSTOP) powder Apply  topically. 12/16/22 06/14/23  [provider]  omeprazole (PRILOSEC) 20 MG capsule Take 1 tablet by mouth daily. 03/23/22   [provider]  OZEMPIC, 1 MG/DOSE, 4 MG/3ML SOPN Inject into the skin. 03/23/22   [provider]  spironolactone (ALDACTONE) 25 MG tablet Take 12.5 mg by mouth. 10/14/22 10/14/23  [provider]    Family History History reviewed. No pertinent family history. Social History Social History   Tobacco Use   Smoking status: Never   Smokeless tobacco: Never  Vaping Use   Vaping status: Never Used  Substance Use Topics   Alcohol use: Yes    Comment: rare   Drug use: No   Allergies   Demerol [meperidine] and Sumatriptan  Review of Systems Review of Systems Pertinent findings revealed after performing a 14 point review of systems has been noted in the history of present illness.  Physical Exam Vital Signs BP (!) 148/101 (BP Location: Right Arm)   Pulse 83   Temp 98.6 F (37 C) (Oral)   Resp 15   SpO2 96%   No data found.  Physical Exam Vitals and nursing note reviewed.  Constitutional:      General: She is not in acute distress.    Appearance: Normal appearance.  HENT:     Head: Normocephalic and atraumatic.  Eyes:  Pupils: Pupils are equal, round, and reactive to light.     Comments: Swelling of right upper and lower eyelids with mild erythema, nontender to palpation  Cardiovascular:     Rate and Rhythm: Normal rate and regular rhythm.  Pulmonary:     Effort: Pulmonary effort is normal.     Breath sounds: Normal breath sounds.  Musculoskeletal:        General: Normal range of motion.     Cervical back: Normal range of motion and neck supple.  Skin:    General: Skin is warm and dry.  Neurological:     General: No focal deficit present.     Mental Status: She is alert and oriented to person, place, and time. Mental status is at baseline.  Psychiatric:        Mood and Affect: Mood normal.         Behavior: Behavior normal.        Thought Content: Thought content normal.        Judgment: Judgment normal.     Visual Acuity Right Eye Distance: 20/30 -1 with glasses Left Eye Distance: 20/25 -1 with glasses Bilateral Distance: 20/20 with glasses  Right Eye Near:   Left Eye Near:    Bilateral Near:     UC Couse / Diagnostics / Procedures:     Radiology No results found.  Procedures Procedures (including critical care time) EKG  Pending results:  Labs Reviewed - No data to display  Medications Ordered in UC: Medications - No data to display  UC Diagnoses / Final Clinical Impressions(s)   I have reviewed the triage vital signs and the nursing notes.  Pertinent labs & imaging results that were available during my care of the patient were reviewed by me and considered in my medical decision making (see chart for details).    Final diagnoses:  Preseptal cellulitis of right upper eyelid  Acute conjunctivitis of right eye, unspecified acute conjunctivitis type   Patient provided with new prescription for ciprofloxacin ophthalmic drops and advised to use as directed.  Patient provided with prescription for cefdinir for empiric treatment of presumed preseptal cellulitis.  Conservative care recommended.  Return precautions advised.  Please see discharge instructions below for details of plan of care as provided to patient. ED Prescriptions     Medication Sig Dispense Auth. Provider   cefdinir (OMNICEF) 300 MG capsule Take 1 capsule (300 mg total) by mouth 2 (two) times daily for 7 days. 14 capsule Theadora Rama Scales, PA-C   ciprofloxacin (CILOXAN) 0.3 % ophthalmic solution Administer 1 drop, every 2 hours, while awake, for 2 days. Then 1 drop, every 4 hours, while awake, for the next 5 days. 5 mL Theadora Rama Scales, PA-C      PDMP not reviewed this encounter.  Pending results:  Labs Reviewed - No data to display    Discharge Instructions      Please read  below to learn more about the medications, dosages and frequencies that I recommend to help alleviate your symptoms and to get you feeling better soon:   Omnicef (cefdinir):  Please take one (1) dose twice daily for 7 days.  This antibiotic can cause upset stomach, this will resolve once antibiotics are complete.  You are welcome to take a probiotic, eat yogurt, take Imodium while taking this medication.  Please avoid other systemic medications such as Maalox, Pepto-Bismol or milk of magnesia as they can interfere with the body's ability to absorb the antibiotics.  Ciloxan (ciprofloxacin): Please instill 1 drop into the affected eye(s) every 2 hours while awake for days 1 and 2 then instill 1 drop into the affected eye(s) every 4 hours while awake for days 3 through 7.  Please be sure that you finish the full 7-day treatment.   If you have not had meaningful improvement of your symptoms in the next 2 to 3 days, please follow-up with your ophthalmologist for further evaluation.  Thank you for visiting  Urgent Care today.      Disposition Upon Discharge:  Condition: stable for discharge home  Patient presented with an acute illness with associated systemic symptoms and significant discomfort requiring urgent management. In my opinion, this is a condition that a prudent lay person (someone who possesses an average knowledge of health and medicine) may potentially expect to result in complications if not addressed urgently such as respiratory distress, impairment of bodily function or dysfunction of bodily organs.   Routine symptom specific, illness specific and/or disease specific instructions were discussed with the patient and/or caregiver at length.   As such, the patient has been evaluated and assessed, work-up was performed and treatment was provided in alignment with urgent care protocols and evidence based medicine.  Patient/parent/caregiver has been advised that the patient may  require follow up for further testing and treatment if the symptoms continue in spite of treatment, as clinically indicated and appropriate.  Patient/parent/caregiver has been advised to return to the [facility] or PCP if no better; to PCP or the Emergency Department if new signs and symptoms develop, or if the current signs or symptoms continue to change or worsen for further workup, evaluation and treatment as clinically indicated and appropriate  The patient will follow up with their current PCP if and as advised. If the patient does not currently have a PCP we will assist them in obtaining one.   The patient may need specialty follow up if the symptoms continue, in spite of conservative treatment and management, for further workup, evaluation, consultation and treatment as clinically indicated and appropriate.  Patient/parent/caregiver verbalized understanding and agreement of plan as discussed.  All questions were addressed during visit.  Please see discharge instructions below for further details of plan.  This office note has been dictated using Teaching laboratory technician.  Unfortunately, this method of dictation can sometimes lead to typographical or grammatical errors.  I apologize for your inconvenience in advance if this occurs.  Please do not hesitate to reach out to me if clarification is needed.      Theadora Rama Scales, PA-C 02/26/23 1452
